# Patient Record
Sex: Male | Born: 2000 | Race: White | Hispanic: Yes | Marital: Single | Smoking: Never smoker
Health system: Southern US, Community
[De-identification: ages and names within clinical notes are randomized; demographics above are authoritative.]

## PROBLEM LIST (undated history)

## (undated) DIAGNOSIS — Z9889 Other specified postprocedural states: Secondary | ICD-10-CM

## (undated) DIAGNOSIS — G039 Meningitis, unspecified: Secondary | ICD-10-CM

## (undated) DIAGNOSIS — E669 Obesity, unspecified: Secondary | ICD-10-CM

## (undated) HISTORY — DX: Meningitis, unspecified: G03.9

## (undated) HISTORY — PX: APPENDECTOMY: SHX54

## (undated) HISTORY — PX: TYMPANOSTOMY TUBE PLACEMENT: SHX32

---
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
## 2022-06-30 ENCOUNTER — Encounter: Payer: Self-pay | Admitting: *Deleted
